# Patient Record
Sex: Male | Born: 2000 | Hispanic: No | Marital: Single | Smoking: Current some day smoker
Health system: Southern US, Community
[De-identification: ages and names within clinical notes are randomized; demographics above are authoritative.]

## PROBLEM LIST (undated history)

## (undated) DIAGNOSIS — R519 Headache, unspecified: Secondary | ICD-10-CM

## (undated) DIAGNOSIS — J45909 Unspecified asthma, uncomplicated: Secondary | ICD-10-CM

## (undated) DIAGNOSIS — B009 Herpesviral infection, unspecified: Secondary | ICD-10-CM

## (undated) DIAGNOSIS — J302 Other seasonal allergic rhinitis: Secondary | ICD-10-CM

## (undated) HISTORY — PX: APPENDECTOMY: SHX54

## (undated) HISTORY — PX: NO PAST SURGERIES: SHX2092

---
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
## 2022-04-14 ENCOUNTER — Emergency Department (HOSPITAL_COMMUNITY): Payer: Medicaid Other

## 2022-04-14 ENCOUNTER — Other Ambulatory Visit: Payer: Self-pay

## 2022-04-14 ENCOUNTER — Emergency Department (HOSPITAL_COMMUNITY)
Admission: EM | Admit: 2022-04-14 | Discharge: 2022-04-14 | Disposition: A | Discharge status: Home / Self Care | Payer: Medicaid Other | Attending: Emergency Medicine | Admitting: Emergency Medicine

## 2022-04-14 ENCOUNTER — Encounter (HOSPITAL_COMMUNITY): Payer: Self-pay

## 2022-04-14 DIAGNOSIS — R112 Nausea with vomiting, unspecified: Secondary | ICD-10-CM | POA: Diagnosis present

## 2022-04-14 DIAGNOSIS — E860 Dehydration: Secondary | ICD-10-CM

## 2022-04-14 DIAGNOSIS — F419 Anxiety disorder, unspecified: Secondary | ICD-10-CM | POA: Diagnosis not present

## 2022-04-14 DIAGNOSIS — F1290 Cannabis use, unspecified, uncomplicated: Secondary | ICD-10-CM

## 2022-04-14 LAB — URINALYSIS, ROUTINE W REFLEX MICROSCOPIC
Bilirubin Urine: NEGATIVE
Glucose, UA: NEGATIVE mg/dL
Hgb urine dipstick: NEGATIVE
Ketones, ur: 20 mg/dL — AB
Leukocytes,Ua: NEGATIVE
Nitrite: NEGATIVE
Protein, ur: NEGATIVE mg/dL
Specific Gravity, Urine: 1.018 (ref 1.005–1.030)
pH: 6 (ref 5.0–8.0)

## 2022-04-14 LAB — CBC
HCT: 44.9 % (ref 39.0–52.0)
Hemoglobin: 15.6 g/dL (ref 13.0–17.0)
MCH: 31.1 pg (ref 26.0–34.0)
MCHC: 34.7 g/dL (ref 30.0–36.0)
MCV: 89.6 fL (ref 80.0–100.0)
Platelets: 225 10*3/uL (ref 150–400)
RBC: 5.01 MIL/uL (ref 4.22–5.81)
RDW: 13 % (ref 11.5–15.5)
WBC: 6.6 10*3/uL (ref 4.0–10.5)
nRBC: 0 % (ref 0.0–0.2)

## 2022-04-14 LAB — TROPONIN I (HIGH SENSITIVITY): Troponin I (High Sensitivity): <2 ng/L (ref <18)

## 2022-04-14 LAB — BASIC METABOLIC PANEL
Anion gap: 13 (ref 5–15)
BUN: 14 mg/dL (ref 6–20)
CO2: 20 mmol/L — ABNORMAL LOW (ref 22–32)
Calcium: 9.5 mg/dL (ref 8.9–10.3)
Chloride: 103 mmol/L (ref 98–111)
Creatinine, Ser: 0.81 mg/dL (ref 0.61–1.24)
GFR, Estimated: >60 mL/min (ref >60)
Glucose, Bld: 103 mg/dL — ABNORMAL HIGH (ref 70–99)
Potassium: 3.5 mmol/L (ref 3.5–5.1)
Sodium: 136 mmol/L (ref 135–145)

## 2022-04-14 LAB — LIPASE, BLOOD: Lipase: 31 U/L (ref 11–51)

## 2022-04-14 MED ORDER — ONDANSETRON 4 MG PO TBDP: 4.0000 mg | ORAL_TABLET | Freq: Three times a day (TID) | ORAL | 0 refills | Status: DC | PRN

## 2022-04-14 MED ORDER — HYDROXYZINE HCL 25 MG PO TABS: 25.0000 mg | ORAL_TABLET | Freq: Three times a day (TID) | ORAL | 0 refills | Status: AC | PRN

## 2022-04-14 MED ORDER — SODIUM CHLORIDE 0.9 % IV BOLUS
1000.0000 mL | Freq: Once | INTRAVENOUS | Status: AC
  Administered 2022-04-14: 1000 mL via INTRAVENOUS

## 2022-04-14 MED ORDER — ONDANSETRON HCL 4 MG/2ML IJ SOLN
4.0000 mg | Freq: Once | INTRAMUSCULAR | Status: AC
  Administered 2022-04-14: 4 mg via INTRAVENOUS
  Filled 2022-04-14: qty 2

## 2022-04-14 NOTE — Discharge Instructions (Addendum)
You need to establish care with a PCP.  Avoid marijuana.

## 2022-04-14 NOTE — ED Notes (Signed)
Patient transported to X-ray 

## 2022-04-14 NOTE — ED Triage Notes (Signed)
Pt c/o SOB, heart racing, lightheaded, N/V. Pt states hasn't been able to eat for a week due to N/V/D.

## 2022-04-14 NOTE — ED Notes (Signed)
Pt is denying nausea, but sts "I still feel anxious and like my heart is beating fast."

## 2022-04-14 NOTE — ED Notes (Addendum)
Pt reports "I think this is just anxiety.  I've been through this before, but I didn't want to depend on anything, so I stopped the treatment.  I dont think I can control it anymore."

## 2022-04-14 NOTE — ED Notes (Signed)
Pt verbalized understanding of discharge instructions. Opportunity for questions provided.  

## 2022-04-14 NOTE — ED Notes (Signed)
Pt reported recent marijuana use to [facility].

## 2022-04-14 NOTE — ED Provider Notes (Signed)
[facility] Provider Note   CSN: [id number] Arrival date & time: 04/14/22  [id number]     History  Chief Complaint  Patient presents with   Shortness of Breath    Marcus Reilly is a 22 y.o. male.  Pt is a 22 yo male with pmhx significant for anxiety and asthma.  Pt said he has been sob, nauseous and poor appetite for the past week.  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  He denies fevers.  He felt like his heart was racing yesterday.  Pt feels like he needs something for anxiety.       Home Medications Prior to Admission medications   Medication Sig Start Date End Date Taking? Authorizing Provider  hydrOXYzine (ATARAX) 25 MG tablet Take 1 tablet (25 mg total) by mouth every 8 (eight) hours as needed for anxiety. 04/14/22  Yes Isla Pence, MD  ondansetron (ZOFRAN-ODT) 4 MG disintegrating tablet Take 1 tablet (4 mg total) by mouth every 8 (eight) hours as needed for nausea or vomiting. 04/14/22  Yes Isla Pence, MD  albuterol (VENTOLIN HFA) 108 (90 Base) MCG/ACT inhaler Inhale 1-2 puffs into the lungs every 4 (four) hours as needed for wheezing or shortness of breath. 05/07/19   [provider]  bismuth subsalicylate (PEPTO BISMOL) 262 MG chewable tablet Chew 262-524 mg by mouth daily as needed for indigestion.    [provider]  cetirizine (ZYRTEC) 10 MG tablet Take 10 mg by mouth daily as needed for allergies (when not taking Benadryl).    [provider]  diphenhydrAMINE (BENADRYL) 25 MG tablet Take 25 mg by mouth every 6 (six) hours as needed for allergies (when not taking Zyrtec).    [provider]  ibuprofen (ADVIL) 200 MG tablet Take 200-800 mg by mouth every 6 (six) hours as needed (for headaches).    [provider]  ibuprofen (ADVIL) 600 MG tablet Take 1 tablet (600 mg total) by mouth every 6 (six) hours as needed. Patient not taking: Reported on  08/14/2021 05/08/21   Isla Pence, MD  methocarbamol (ROBAXIN) 500 MG tablet Take 1 tablet (500 mg total) by mouth 2 (two) times daily. Patient not taking: Reported on 08/14/2021 05/08/21   Isla Pence, MD  ondansetron (ZOFRAN) 8 MG tablet Take 1 tablet (8 mg total) by mouth every 8 (eight) hours as needed for nausea or vomiting. 12/29/21   Pattricia Boss, MD  oxyCODONE-acetaminophen (PERCOCET) 5-325 MG tablet Take 1 tablet by mouth every 4 (four) hours as needed for severe pain. 08/15/21 08/15/22  Stechschulte, Nickola Major, MD  valACYclovir (VALTREX) 1000 MG tablet Take 1 tablet (1,000 mg total) by mouth 2 (two) times daily. Patient not taking: Reported on 08/14/2021 05/13/19   Charmayne Sheer, NP      Allergies    Patient has no known allergies.    Review of Systems   Review of Systems  Gastrointestinal:  Positive for nausea and vomiting.  Psychiatric/Behavioral:  The patient is nervous/anxious.   All other systems reviewed and are negative.   Physical Exam Updated Vital Signs BP (!) 111/55   Pulse 62   Temp (!) 97.3 F (36.3 C) (Oral)   Resp 15   Ht '5\' 4"'$  (1.626 m)   Wt 56.8 kg   SpO2 100%   BMI 21.49 kg/m  Physical Exam Vitals and nursing note reviewed.  Constitutional:      Appearance: He is well-developed.  HENT:     Head: Normocephalic and atraumatic.     Mouth/Throat:     Mouth: Mucous membranes are dry.  Eyes:     Extraocular Movements: Extraocular movements intact.     Pupils: Pupils are equal, round, and reactive to light.  Cardiovascular:     Rate and Rhythm: Normal rate and regular rhythm.  Pulmonary:     Effort: Pulmonary effort is normal.     Breath sounds: Normal breath sounds.  Abdominal:     General: Bowel sounds are normal.     Palpations: Abdomen is soft.  Musculoskeletal:        General: Normal range of motion.     Cervical back: Normal range of motion and neck supple.  Skin:    General: Skin is warm.     Capillary Refill: Capillary refill takes  less than 2 seconds.  Neurological:     General: No focal deficit present.     Mental Status: He is alert and oriented to person, place, and time.  Psychiatric:        Mood and Affect: Mood is anxious.        Behavior: Behavior normal.     ED Results / Procedures / Treatments   Labs (all labs ordered are listed, but only abnormal results are displayed) Labs Reviewed  BASIC METABOLIC PANEL - Abnormal; Notable for the following components:      Result Value   CO2 20 (*)    Glucose, Bld 103 (*)    All other components within normal limits  URINALYSIS, ROUTINE W REFLEX MICROSCOPIC - Abnormal; Notable for the following components:   Ketones, ur 20 (*)    All other components within normal limits  CBC  LIPASE, BLOOD  TROPONIN I (HIGH SENSITIVITY)    EKG EKG Interpretation  Date/Time:  Sunday April 14 2022 08:48:35 EST Ventricular Rate:  82 PR Interval:  122 QRS Duration: 96 QT Interval:  350 QTC Calculation: 408 R Axis:   88 Text Interpretation: Normal sinus rhythm with sinus arrhythmia Normal ECG When compared with ECG of 29-Dec-2021 09:22, PREVIOUS ECG IS PRESENT No significant change since last tracing Confirmed by Isla Pence [phone]) on 04/14/2022 9:04:24 AM  Radiology DG Chest 2 View  Result Date: 04/14/2022 CLINICAL DATA:  Chest pain EXAM: CHEST - 2 VIEW COMPARISON:  None Available. FINDINGS: The heart size and mediastinal contours are within normal limits. Both lungs are clear. The visualized skeletal structures are unremarkable. IMPRESSION: No active cardiopulmonary disease. Electronically Signed   By: Davina Poke D.O.   On: 04/14/2022 09:19    Procedures Procedures    Medications Ordered in ED Medications  sodium chloride 0.9 % bolus 1,000 mL (0 mLs Intravenous Stopped 04/14/22 1057)  ondansetron (ZOFRAN) injection 4 mg (4 mg Intravenous Given 04/14/22 [id number])    ED Course/ Medical Decision Making/ A&P                             Medical Decision  Making Amount and/or Complexity of Data Reviewed Labs: ordered. Radiology: ordered.  Risk Prescription drug management.   This patient presents to the ED for concern of n/v, this involves an extensive number of treatment options, and is a complaint that carries with it a high risk of complications and morbidity.  The differential diagnosis includes infection, anxiety, electrolyte abn, cannabinoid hyperemesis   Co morbidities that complicate the patient evaluation  Asthma and anxiety   Additional  history obtained:  Additional history obtained from epic chart review   Lab Tests:  I Ordered, and personally interpreted labs.  The pertinent results include:  cbc nl, bmp nl, ua nl, trop nl, lip nl, ua + ketones   Imaging Studies ordered:  I ordered imaging studies including cxr  I independently visualized and interpreted imaging which showed No active cardiopulmonary disease.  I agree with the radiologist interpretation   Cardiac Monitoring:  The patient was maintained on a cardiac monitor.  I personally viewed and interpreted the cardiac monitored which showed an underlying rhythm of: nsr   Medicines ordered and prescription drug management:  I ordered medication including ivfs and zofran  for sx  Reevaluation of the patient after these medicines showed that the patient improved I have reviewed the patients home medicines and have made adjustments as needed   Problem List / ED Course:  N/v:  possibly due to anxiety.  No lab abn and he is tolerating fluids after meds.  Pt is stable for d/c.  He is to return if worse.  He needs to establish care with a pcp.   Reevaluation:  After the interventions noted above, I reevaluated the patient and found that they have :improved   Social Determinants of Health:  Lives at home   Dispostion:  After consideration of the diagnostic results and the patients response to treatment, I feel that the patent would benefit from  discharge with outpatient f/u.          Final Clinical Impression(s) / ED Diagnoses Final diagnoses:  Nausea and vomiting, unspecified vomiting type  Dehydration  Anxiety  Marijuana use    Rx / DC Orders ED Discharge Orders          Ordered    hydrOXYzine (ATARAX) 25 MG tablet  Every 8 hours PRN        04/14/22 1100    ondansetron (ZOFRAN-ODT) 4 MG disintegrating tablet  Every 8 hours PRN        04/14/22 1102              Isla Pence, MD 04/14/22 1208
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
## 2022-06-08 ENCOUNTER — Ambulatory Visit (HOSPITAL_COMMUNITY)
Admission: EM | Admit: 2022-06-08 | Discharge: 2022-06-08 | Disposition: A | Discharge status: Home / Self Care | Payer: Medicaid Other | Attending: Physician Assistant | Admitting: Physician Assistant

## 2022-06-08 ENCOUNTER — Other Ambulatory Visit: Payer: Self-pay

## 2022-06-08 ENCOUNTER — Encounter (HOSPITAL_COMMUNITY): Payer: Self-pay | Admitting: Emergency Medicine

## 2022-06-08 DIAGNOSIS — R300 Dysuria: Secondary | ICD-10-CM | POA: Insufficient documentation

## 2022-06-08 DIAGNOSIS — Z113 Encounter for screening for infections with a predominantly sexual mode of transmission: Secondary | ICD-10-CM | POA: Diagnosis present

## 2022-06-08 LAB — POCT URINALYSIS DIP (MANUAL ENTRY)
Bilirubin, UA: NEGATIVE
Blood, UA: NEGATIVE
Glucose, UA: NEGATIVE mg/dL
Ketones, POC UA: NEGATIVE mg/dL
Leukocytes, UA: NEGATIVE
Nitrite, UA: NEGATIVE
Protein Ur, POC: NEGATIVE mg/dL
Spec Grav, UA: 1.02 (ref 1.010–1.025)
Urobilinogen, UA: 1 E.U./dL
pH, UA: 7.5 (ref 5.0–8.0)

## 2022-06-08 LAB — HEPATITIS C ANTIBODY: HCV Ab: NONREACTIVE

## 2022-06-08 LAB — HIV ANTIBODY (ROUTINE TESTING W REFLEX): HIV Screen 4th Generation wRfx: NONREACTIVE

## 2022-06-08 NOTE — ED Provider Notes (Signed)
[facility]    CSN: [id number] Arrival date & time: 06/08/22  1054      History   Chief Complaint Chief Complaint  Patient presents with   SEXUALLY TRANSMITTED DISEASE    HPI Marcus Reilly is a 22 y.o. male.   Patient presents today with a 3-day history of dysuria.  He reports a mild burning sensation particularly after he finishes urinating.  He denies any associated penile discharge, genital lesions, fever, nausea, vomiting.  He denies history of recurrent UTI or nephrolithiasis.  Denies history of diabetes or immunosuppression.  He does not take SGLT2 inhibitor.  He is sexually active with male partners but does not always use condoms.  He denies history of STI.  He has not tried any over-the-counter medication for symptom management.  Denies any recent antibiotic use.    Past Medical History:  Diagnosis Date   Asthma    Headache    HSV infection    Seasonal allergies     Patient Active Problem List   Diagnosis Date Noted   Status post surgery 08/14/2021   Appendicitis 08/14/2021    Past Surgical History:  Procedure Laterality Date   APPENDECTOMY     LAPAROSCOPIC APPENDECTOMY N/A 08/14/2021   Procedure: APPENDECTOMY LAPAROSCOPIC;  Surgeon: Quentin Ore, MD;  Location: [facility] OR;  Service: General;  Laterality: N/A;       Home Medications    Prior to Admission medications   Medication Sig Start Date End Date Taking? Authorizing Provider  albuterol (VENTOLIN HFA) 108 (90 Base) MCG/ACT inhaler Inhale 1-2 puffs into the lungs every 4 (four) hours as needed for wheezing or shortness of breath. 05/07/19   [provider]  bismuth subsalicylate (PEPTO BISMOL) 262 MG chewable tablet Chew 262-524 mg by mouth daily as needed for indigestion. Patient not taking: Reported on 06/08/2022    [provider]  cetirizine (ZYRTEC) 10 MG tablet Take 10 mg by mouth daily as needed for allergies (when not taking Benadryl).    [provider]  diphenhydrAMINE (BENADRYL) 25 MG tablet Take 25 mg by mouth every 6 (six) hours as needed for allergies (when not taking Zyrtec). Patient not taking: Reported on 06/08/2022    [provider]  hydrOXYzine (ATARAX) 25 MG tablet Take 1 tablet (25 mg total) by mouth every 8 (eight) hours as needed for anxiety. Patient not taking: Reported on 06/08/2022 04/14/22   Jacalyn Lefevre, MD  ibuprofen (ADVIL) 200 MG tablet Take 200-800 mg by mouth every 6 (six) hours as needed (for headaches).    [provider]  ibuprofen (ADVIL) 600 MG tablet Take 1 tablet (600 mg total) by mouth every 6 (six) hours as needed. Patient not taking: Reported on 08/14/2021 05/08/21   Jacalyn Lefevre, MD  methocarbamol (ROBAXIN) 500 MG tablet Take 1 tablet (500 mg total) by mouth 2 (two) times daily. Patient not taking: Reported on 08/14/2021 05/08/21   Jacalyn Lefevre, MD  ondansetron (ZOFRAN) 8 MG tablet Take 1 tablet (8 mg total) by mouth every 8 (eight) hours as needed for nausea or vomiting. Patient not taking: Reported on 06/08/2022 12/29/21   Margarita Grizzle, MD  ondansetron (ZOFRAN-ODT) 4 MG disintegrating tablet Take 1 tablet (4 mg total) by mouth every 8 (eight) hours as needed for nausea or vomiting. Patient not taking: Reported on 06/08/2022 04/14/22   Jacalyn Lefevre, MD  oxyCODONE-acetaminophen (PERCOCET) 5-325 MG tablet Take 1 tablet by mouth every 4 (four) hours as needed for severe pain. Patient  not taking: Reported on 06/08/2022 08/15/21 08/15/22  Stechschulte, Hyman Hopes, MD  valACYclovir (VALTREX) 1000 MG tablet Take 1 tablet (1,000 mg total) by mouth 2 (two) times daily. Patient not taking: Reported on 08/14/2021 05/13/19   Viviano Simas, NP    Family History Family History  Problem Relation Age of Onset   Kidney failure Mother     Social History Social History   Tobacco Use   Smoking status: Some Days    Types: Cigarettes, Cigars   Smokeless tobacco: Never  Vaping Use    Vaping Use: Former   Substances: THC  Substance Use Topics   Alcohol use: Yes    Alcohol/week: 2.0 standard drinks of alcohol    Types: 2 Standard drinks or equivalent per week   Drug use: Yes    Types: Marijuana     Allergies   Patient has no known allergies.   Review of Systems Review of Systems  Constitutional:  Negative for activity change, appetite change, fatigue and fever.  Gastrointestinal:  Negative for abdominal pain, diarrhea, nausea and vomiting.  Genitourinary:  Positive for dysuria. Negative for frequency, genital sores, hematuria, penile discharge, penile pain, penile swelling, scrotal swelling and urgency.     Physical Exam Triage Vital Signs ED Triage Vitals  Enc Vitals Group     BP 06/08/22 1143 133/75     Pulse Rate 06/08/22 1143 81     Resp 06/08/22 1143 18     Temp 06/08/22 1143 97.8 F (36.6 C)     Temp Source 06/08/22 1143 Oral     SpO2 06/08/22 1143 98 %     Weight --      Height --      Head Circumference --      Peak Flow --      Pain Score 06/08/22 1139 7     Pain Loc --      Pain Edu? --      Excl. in GC? --    No data found.  Updated Vital Signs BP 133/75 (BP Location: Right Arm)   Pulse 81   Temp 97.8 F (36.6 C) (Oral)   Resp 18   SpO2 98%   Visual Acuity Right Eye Distance:   Left Eye Distance:   Bilateral Distance:    Right Eye Near:   Left Eye Near:    Bilateral Near:     Physical Exam Vitals reviewed.  Constitutional:      General: He is awake.     Appearance: Normal appearance. He is well-developed. He is not ill-appearing.     Comments: Very pleasant male appear stated age in no acute distress sitting comfortably in exam room  HENT:     Head: Normocephalic and atraumatic.     Mouth/Throat:     Pharynx: Uvula midline. No oropharyngeal exudate or posterior oropharyngeal erythema.  Cardiovascular:     Rate and Rhythm: Normal rate and regular rhythm.     Heart sounds: Normal heart sounds, S1 normal and S2  normal. No murmur heard. Pulmonary:     Effort: Pulmonary effort is normal.     Breath sounds: Normal breath sounds. No stridor. No wheezing, rhonchi or rales.     Comments: Clear to auscultation bilateral Abdominal:     General: Bowel sounds are normal.     Palpations: Abdomen is soft.     Tenderness: There is no abdominal tenderness. There is no right CVA tenderness, left CVA tenderness, guarding or rebound.  Neurological:  Mental Status: He is alert.  Psychiatric:        Behavior: Behavior is cooperative.      UC Treatments / Results  Labs (all labs ordered are listed, but only abnormal results are displayed) Labs Reviewed  RPR  HIV ANTIBODY (ROUTINE TESTING W REFLEX)  HEPATITIS C ANTIBODY  POCT URINALYSIS DIP (MANUAL ENTRY)  CYTOLOGY, (ORAL, ANAL, URETHRAL) ANCILLARY ONLY    EKG   Radiology No results found.  Procedures Procedures (including critical care time)  Medications Ordered in UC Medications - No data to display  Initial Impression / Assessment and Plan / UC Course  I have reviewed the triage vital signs and the nursing notes.  Pertinent labs & imaging results that were available during my care of the patient were reviewed by me and considered in my medical decision making (see chart for details).     Patient is well-appearing, afebrile, nontoxic, nontachycardic.  Urine was normal.  STI testing is pending.  Had an at length conversation about STIs and the importance of safe sex practices.  Patient was provided handout on safe sex practices as well as condoms.  He was encouraged to push fluids.  Discussed that if all of his testing is negative and he continues to have symptoms he should follow-up with urology and was given contact information for local provider.  We did discuss potential utility of empirically treating given his current symptoms but he ultimately declined this and will return based on his results.  Discussed that if he has any worsening or  changing symptoms he should return for reevaluation.  All questions answered to patient satisfaction.  Final Clinical Impressions(s) / UC Diagnoses   Final diagnoses:  Dysuria  Screening examination for STI     Discharge Instructions      Your urine was normal with no evidence of infection.  Make sure that you are drinking plenty of fluid.  We will contact you if any of your lab work is abnormal.  Monitor your MyChart for these results as we will only call you if something is positive.  If your lab work is normal and you continue to have symptoms please follow-up with urology; call to schedule an appointment.  Do not have sex until you receive your results.  If you are positive all partners to be tested and treated as well.  Use a condom with each sexual encounter.  If anything changes please return for reevaluation.     ED Prescriptions   None    PDMP not reviewed this encounter.   Jeani Hawking, PA-C 06/08/22 1258

## 2022-06-08 NOTE — Discharge Instructions (Addendum)
Your urine was normal with no evidence of infection.  Make sure that you are drinking plenty of fluid.  We will contact you if any of your lab work is abnormal.  Monitor your MyChart for these results as we will only call you if something is positive.  If your lab work is normal and you continue to have symptoms please follow-up with urology; call to schedule an appointment.  Do not have sex until you receive your results.  If you are positive all partners to be tested and treated as well.  Use a condom with each sexual encounter.  If anything changes please return for reevaluation.

## 2022-06-08 NOTE — ED Triage Notes (Signed)
Burning sometimes while urinating and sometimes after flow is stopped.  Onset 3 days ago.  Denies discharge

## 2022-06-09 LAB — RPR: RPR Ser Ql: NONREACTIVE

## 2022-06-10 LAB — CYTOLOGY, (ORAL, ANAL, URETHRAL) ANCILLARY ONLY
Chlamydia: NEGATIVE
Comment: NEGATIVE
Comment: NEGATIVE
Comment: NORMAL
Neisseria Gonorrhea: NEGATIVE
Trichomonas: NEGATIVE
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
## 2022-07-30 ENCOUNTER — Ambulatory Visit (INDEPENDENT_AMBULATORY_CARE_PROVIDER_SITE_OTHER): Payer: Medicaid Other | Admitting: Neurology

## 2022-07-30 ENCOUNTER — Encounter: Payer: Self-pay | Admitting: Neurology

## 2022-07-30 VITALS — BP 141/78 | HR 67 | Ht 64.0 in | Wt 132.0 lb

## 2022-07-30 DIAGNOSIS — M5481 Occipital neuralgia: Secondary | ICD-10-CM

## 2022-07-30 DIAGNOSIS — M542 Cervicalgia: Secondary | ICD-10-CM

## 2022-07-30 DIAGNOSIS — Z87828 Personal history of other (healed) physical injury and trauma: Secondary | ICD-10-CM

## 2022-07-30 NOTE — Patient Instructions (Signed)
Check MRI of cervical spine.  Further recommendations pending results.

## 2022-08-03 ENCOUNTER — Encounter (HOSPITAL_COMMUNITY): Payer: Self-pay

## 2022-08-03 ENCOUNTER — Ambulatory Visit (HOSPITAL_COMMUNITY)
Admission: EM | Admit: 2022-08-03 | Discharge: 2022-08-03 | Disposition: A | Discharge status: Home / Self Care | Payer: Medicaid Other | Attending: Physician Assistant | Admitting: Physician Assistant

## 2022-08-03 DIAGNOSIS — N481 Balanitis: Secondary | ICD-10-CM | POA: Diagnosis not present

## 2022-08-03 MED ORDER — CLOTRIMAZOLE 1 % EX CREA: TOPICAL_CREAM | CUTANEOUS | 0 refills | Status: AC

## 2022-08-03 NOTE — ED Triage Notes (Signed)
Patient here today with c/o burning sensation at the tip of his penis and pain in groin area X 2 days. He notices some white substance at the gran and irritation. His girlfriend has a yeast infection. Patient is uncircumcised.

## 2022-08-03 NOTE — Discharge Instructions (Signed)
Keep the area clean with soap and water.  Apply clotrimazole twice daily.  Use hypoallergenic soaps and detergents.  Use this cream for minimum of 2 weeks.  If it is not improving quickly or if anything changes and you develop additional lesions or spread of rash please be seen immediately.

## 2022-08-03 NOTE — ED Provider Notes (Signed)
[facility]    CSN: [id number] Arrival date & time: 08/03/22  1201      History   Chief Complaint Chief Complaint  Patient presents with   Rash    Penis    HPI Saahas Reilly is a 22 y.o. male.   Patient presents today with a 2-day history of irritated rash of his penis.  Reports that symptoms have gradually worsened prompting evaluation.  He is sexually active with a male partner and reports that she was recently treated for yeast infection and he is concerned that he might have gotten this from her.  Denies any changes to personal hygiene products including soaps or detergents.  Denies any medication changes or antibiotic use.  He denies history of diabetes, HIV, immunosuppression has not take SGLT2 inhibitor.  He has not tried any over-the-counter medication for symptom management.  He does have a history of HSV but current symptoms are not similar to previous episodes of this condition.  He has been cleaning area more aggressively which has caused some increase in irritation.  Denies any additional symptoms including fever, nausea, vomiting, abdominal pain, penile discharge, dysuria, urinary symptoms.    Past Medical History:  Diagnosis Date   Asthma    Headache    HSV infection    Seasonal allergies     Patient Active Problem List   Diagnosis Date Noted   Status post surgery 08/14/2021   Appendicitis 08/14/2021    Past Surgical History:  Procedure Laterality Date   APPENDECTOMY     LAPAROSCOPIC APPENDECTOMY N/A 08/14/2021   Procedure: APPENDECTOMY LAPAROSCOPIC;  Surgeon: Quentin Ore, MD;  Location: [facility] OR;  Service: General;  Laterality: N/A;       Home Medications    Prior to Admission medications   Medication Sig Start Date End Date Taking? Authorizing Provider  albuterol (VENTOLIN HFA) 108 (90 Base) MCG/ACT inhaler Inhale 1-2 puffs into the lungs every 4 (four) hours as needed for wheezing or shortness of breath. 05/07/19  Yes  [provider]  clotrimazole (LOTRIMIN) 1 % cream Apply to affected area 2 times daily 08/03/22  Yes Shemeka Wardle K, PA-C  cetirizine (ZYRTEC) 10 MG tablet Take 10 mg by mouth daily as needed for allergies (when not taking Benadryl).    [provider]  hydrOXYzine (ATARAX) 25 MG tablet Take 1 tablet (25 mg total) by mouth every 8 (eight) hours as needed for anxiety. Patient not taking: Reported on 07/30/2022 04/14/22   Jacalyn Lefevre, MD    Family History Family History  Problem Relation Age of Onset   Kidney failure Mother    Migraines Brother     Social History Social History   Tobacco Use   Smoking status: Some Days    Types: Cigarettes, Cigars   Smokeless tobacco: Never  Vaping Use   Vaping Use: Former   Substances: THC  Substance Use Topics   Alcohol use: Not Currently    Alcohol/week: 2.0 standard drinks of alcohol    Types: 2 Standard drinks or equivalent per week   Drug use: Yes    Types: Marijuana     Allergies   Patient has no known allergies.   Review of Systems Review of Systems  Constitutional:  Positive for activity change. Negative for appetite change, fatigue and fever.  Gastrointestinal:  Negative for abdominal pain, diarrhea, nausea and vomiting.  Genitourinary:  Negative for dysuria, frequency, genital sores, penile pain, penile swelling and urgency.  Skin:  Positive for rash.  Physical Exam Triage Vital Signs ED Triage Vitals  Enc Vitals Group     BP 08/03/22 1227 129/87     Pulse Rate 08/03/22 1227 79     Resp 08/03/22 1227 16     Temp 08/03/22 1227 98 F (36.7 C)     Temp Source 08/03/22 1227 Oral     SpO2 08/03/22 1227 97 %     Weight 08/03/22 1227 132 lb (59.9 kg)     Height 08/03/22 1227 5\' 4"  (1.626 m)     Head Circumference --      Peak Flow --      Pain Score 08/03/22 1226 7     Pain Loc --      Pain Edu? --      Excl. in GC? --    No data found.  Updated Vital Signs BP 129/87 (BP Location: Right  Arm)   Pulse 79   Temp 98 F (36.7 C) (Oral)   Resp 16   Ht 5\' 4"  (1.626 m)   Wt 132 lb (59.9 kg)   SpO2 97%   BMI 22.66 kg/m   Visual Acuity Right Eye Distance:   Left Eye Distance:   Bilateral Distance:    Right Eye Near:   Left Eye Near:    Bilateral Near:     Physical Exam Vitals reviewed. Exam conducted with a chaperone present.  Constitutional:      General: He is awake.     Appearance: Normal appearance. He is well-developed. He is not ill-appearing.     Comments: Very pleasant male appears stated age in no acute distress sitting comfortably in exam room  HENT:     Head: Normocephalic and atraumatic.  Cardiovascular:     Rate and Rhythm: Normal rate and regular rhythm.     Heart sounds: Normal heart sounds, S1 normal and S2 normal. No murmur heard. Pulmonary:     Effort: Pulmonary effort is normal.     Breath sounds: Normal breath sounds. No stridor. No wheezing, rhonchi or rales.     Comments: Clear to auscultation bilaterally Abdominal:     General: Bowel sounds are normal.     Palpations: Abdomen is soft.     Tenderness: There is no abdominal tenderness. There is no right CVA tenderness, left CVA tenderness, guarding or rebound.  Genitourinary:    Penis: Uncircumcised. No phimosis, paraphimosis or discharge.      Testes: Normal.     Comments: Dee, CMA present as chaperone during exam.  Erythematous maculopapular rash with white drainage noted along glans. Neurological:     Mental Status: He is alert.  Psychiatric:        Behavior: Behavior is cooperative.      UC Treatments / Results  Labs (all labs ordered are listed, but only abnormal results are displayed) Labs Reviewed - No data to display  EKG   Radiology No results found.  Procedures Procedures (including critical care time)  Medications Ordered in UC Medications - No data to display  Initial Impression / Assessment and Plan / UC Course  I have reviewed the triage vital signs and  the nursing notes.  Pertinent labs & imaging results that were available during my care of the patient were reviewed by me and considered in my medical decision making (see chart for details).     Symptoms are consistent with yeast balanitis.  He was encouraged to keep the area clean and apply clotrimazole ointment twice daily for minimum 2  weeks.  He is to use hypoallergenic soaps and detergents.  Discussed that if his symptoms persist or if they are not improving quickly he should return for reevaluation.  If he develops any additional symptoms including abdominal pain, penile discharge, urinary symptoms, fever he needs to be seen immediately.  Strict return precautions given.    Final Clinical Impressions(s) / UC Diagnoses   Final diagnoses:  Balanitis     Discharge Instructions      Keep the area clean with soap and water.  Apply clotrimazole twice daily.  Use hypoallergenic soaps and detergents.  Use this cream for minimum of 2 weeks.  If it is not improving quickly or if anything changes and you develop additional lesions or spread of rash please be seen immediately.     ED Prescriptions     Medication Sig Dispense Auth. Provider   clotrimazole (LOTRIMIN) 1 % cream Apply to affected area 2 times daily 30 g Tyjae Issa K, PA-C      PDMP not reviewed this encounter.   Jeani Hawking, PA-C 08/03/22 1305

## 2022-08-15 ENCOUNTER — Encounter: Payer: Self-pay | Admitting: Neurology

## 2022-08-21 ENCOUNTER — Other Ambulatory Visit: Payer: Medicaid Other

## 2022-08-21 ENCOUNTER — Encounter: Payer: Self-pay | Admitting: Neurology

## 2022-08-23 ENCOUNTER — Other Ambulatory Visit: Payer: Medicaid Other

## 2022-09-05 ENCOUNTER — Telehealth: Payer: Self-pay

## 2022-09-05 DIAGNOSIS — Z87828 Personal history of other (healed) physical injury and trauma: Secondary | ICD-10-CM

## 2022-09-05 NOTE — Telephone Encounter (Signed)
PA denied for MRI

## 2022-09-12 ENCOUNTER — Telehealth: Payer: Self-pay | Admitting: Neurology

## 2022-09-12 NOTE — Telephone Encounter (Signed)
Patient is calling to return a missed call.Marcus Reilly

## 2022-09-12 NOTE — Telephone Encounter (Signed)
LMOVM per dr.Jaffe, MRI denied, I would at least like to get cervical spine X-ray

## 2022-09-13 NOTE — Telephone Encounter (Signed)
See encounter 09/12/22

## 2023-01-09 ENCOUNTER — Encounter (HOSPITAL_COMMUNITY): Payer: Self-pay | Admitting: Emergency Medicine

## 2023-01-09 ENCOUNTER — Ambulatory Visit (HOSPITAL_COMMUNITY)
Admission: EM | Admit: 2023-01-09 | Discharge: 2023-01-09 | Disposition: A | Discharge status: Home / Self Care | Payer: Medicaid Other | Attending: Family Medicine | Admitting: Family Medicine

## 2023-01-09 DIAGNOSIS — R1032 Left lower quadrant pain: Secondary | ICD-10-CM | POA: Diagnosis not present

## 2023-01-09 DIAGNOSIS — N50812 Left testicular pain: Secondary | ICD-10-CM

## 2023-01-09 NOTE — ED Provider Notes (Signed)
[facility]   [id number] 01/09/23 Arrival Time: 1323  ASSESSMENT & PLAN:  1. Left lower quadrant abdominal pain   2. Left testicular pain    No signs of testicular torsion. Benign abd pain. Declines lab work including urethral cytology. Is sexually active. OTC Tylenol if needed. Prefers to observe next 48 hours. Agrees to ED eval for imaging if pain returns or worsens.  Reviewed expectations re: course of current medical issues. Questions answered. Outlined signs and symptoms indicating need for more acute intervention. Patient verbalized understanding. After Visit Summary given.   SUBJECTIVE:  Marcus Reilly is a 22 y.o. male who presents with complaint of mild and intermittent LLQ abd pain.  A few days ago note soreness in L testicle; didn't last long; has not recurred. Denies abd/scrotal injury. Denies penile discharge. Normal bowel/bladder habits. Denies fever.  Past Surgical History:  Procedure Laterality Date   APPENDECTOMY     LAPAROSCOPIC APPENDECTOMY N/A 08/14/2021   Procedure: APPENDECTOMY LAPAROSCOPIC;  Surgeon: Quentin Ore, MD;  Location: [facility];  Service: General;  Laterality: N/A;    OBJECTIVE:  Vitals:   01/09/23 1343  BP: 135/81  Pulse: 61  Resp: 16  Temp: 98.2 F (36.8 C)  TempSrc: Oral  SpO2: 98%    General appearance: alert, cooperative, appears stated age and no distress Throat: lips, mucosa, and tongue normal; teeth and gums normal Lungs: unlabored respirations; speaks full sentences without difficulty Back: no CVA tenderness; FROM at waist Abdomen: soft, non-tender; without guarding/rebound TTP GU: normal appearing genitalia; normal scrotal/testicular exam; normal cremasteric reflex Skin: warm and dry Psychological: alert and cooperative; normal mood and affect.  Reviewed: Results for orders placed or performed during the hospital encounter of 06/08/22  RPR  Result Value Ref Range   RPR Ser Ql NON REACTIVE  NON REACTIVE  HIV Antibody (routine testing w rflx)  Result Value Ref Range   HIV Screen 4th Generation wRfx Non Reactive Non Reactive  Hepatitis C antibody  Result Value Ref Range   HCV Ab NON REACTIVE NON REACTIVE  POC urinalysis dipstick  Result Value Ref Range   Color, UA yellow yellow   Clarity, UA clear clear   Glucose, UA negative negative mg/dL   Bilirubin, UA negative negative   Ketones, POC UA negative negative mg/dL   Spec Grav, UA 4.098 1.191 - 1.025   Blood, UA negative negative   pH, UA 7.5 5.0 - 8.0   Protein Ur, POC negative negative mg/dL   Urobilinogen, UA 1.0 0.2 or 1.0 E.U./dL   Nitrite, UA Negative Negative   Leukocytes, UA Negative Negative  Cytology ancillary only  Result Value Ref Range   Neisseria Gonorrhea Negative    Chlamydia Negative    Trichomonas Negative    Comment Normal Reference Range Trichomonas - Negative    Comment Normal Reference Ranger Chlamydia - Negative    Comment      Normal Reference Range Neisseria Gonorrhea - Negative    Labs Reviewed - No data to display  No Known Allergies  Past Medical History:  Diagnosis Date   Asthma    Headache    HSV infection    Seasonal allergies    Family History  Problem Relation Age of Onset   Kidney failure Mother    Migraines Brother    Social History   Socioeconomic History   Marital status: Single    Spouse name: Not on file   Number of children: Not on file   Years of  education: Not on file   Highest education level: Not on file  Occupational History   Not on file  Tobacco Use   Smoking status: Some Days    Types: Cigarettes, Cigars   Smokeless tobacco: Never  Vaping Use   Vaping status: Former   Substances: THC  Substance and Sexual Activity   Alcohol use: Not Currently    Alcohol/week: 2.0 standard drinks of alcohol    Types: 2 Standard drinks or equivalent per week   Drug use: Yes    Types: Marijuana   Sexual activity: Yes  Other Topics Concern   Not on file   Social History Narrative   Right handed   .   Social Determinants of Health   Financial Resource Strain: Not on File (01/02/2022)   Received from [employer]    Financial Resource Strain: 0  Food Insecurity: Not on File (01/02/2022)   Received from [employer]    Food: 0  Transportation Needs: Not on File (01/02/2022)   Received from [employer] Needs    Transportation: 0  Physical Activity: Not at Risk (04/16/2022)   Received from [place], [place]   Physical Activity    Physical Activity: 1  Stress: At Risk (04/16/2022)   Received from [place], [place]   Stress    Stress: 2  Social Connections: Not at Risk (04/16/2022)   Received from [employer] [facility] [employer] [facility]   Social Connections    Connectedness: 1  Intimate Partner Violence: Not on file           Mardella Layman, MD 01/09/23 [phone]

## 2023-01-09 NOTE — ED Triage Notes (Signed)
Pt c/o left side abdominal pain after he went to gym Monday now pain is down in left testicle. Pt states pain comes and goes away.

## 2023-01-09 NOTE — ED Notes (Signed)
Patient is being discharged from the Urgent Care and sent to the Emergency Department via personal opperated vehicle . Per Dr Mardella Layman, patient is in need of higher level of care due to groin pain. Patient is aware and verbalizes understanding of plan of care.  Vitals:   01/09/23 1343  BP: 135/81  Pulse: 61  Resp: 16  Temp: 98.2 F (36.8 C)  SpO2: 98%

## 2023-06-05 ENCOUNTER — Other Ambulatory Visit: Payer: Self-pay

## 2023-06-05 ENCOUNTER — Emergency Department (HOSPITAL_COMMUNITY)
Admission: EM | Admit: 2023-06-05 | Discharge: 2023-06-06 | Disposition: A | Discharge status: Home / Self Care | Attending: Emergency Medicine | Admitting: Emergency Medicine

## 2023-06-05 ENCOUNTER — Emergency Department (HOSPITAL_COMMUNITY)

## 2023-06-05 ENCOUNTER — Encounter (HOSPITAL_COMMUNITY): Payer: Self-pay | Admitting: *Deleted

## 2023-06-05 DIAGNOSIS — R519 Headache, unspecified: Secondary | ICD-10-CM | POA: Diagnosis present

## 2023-06-05 DIAGNOSIS — M5481 Occipital neuralgia: Secondary | ICD-10-CM | POA: Insufficient documentation

## 2023-06-05 LAB — CBC WITH DIFFERENTIAL/PLATELET
Abs Immature Granulocytes: 0.04 10*3/uL (ref 0.00–0.07)
Basophils Absolute: 0 10*3/uL (ref 0.0–0.1)
Basophils Relative: 1 %
Eosinophils Absolute: 0.6 10*3/uL — ABNORMAL HIGH (ref 0.0–0.5)
Eosinophils Relative: 7 %
HCT: 47.6 % (ref 39.0–52.0)
Hemoglobin: 16.1 g/dL (ref 13.0–17.0)
Immature Granulocytes: 1 %
Lymphocytes Relative: 34 %
Lymphs Abs: 2.9 10*3/uL (ref 0.7–4.0)
MCH: 30.4 pg (ref 26.0–34.0)
MCHC: 33.8 g/dL (ref 30.0–36.0)
MCV: 90 fL (ref 80.0–100.0)
Monocytes Absolute: 0.7 10*3/uL (ref 0.1–1.0)
Monocytes Relative: 8 %
Neutro Abs: 4.3 10*3/uL (ref 1.7–7.7)
Neutrophils Relative %: 49 %
Platelets: 243 10*3/uL (ref 150–400)
RBC: 5.29 MIL/uL (ref 4.22–5.81)
RDW: 12.8 % (ref 11.5–15.5)
WBC: 8.4 10*3/uL (ref 4.0–10.5)
nRBC: 0 % (ref 0.0–0.2)

## 2023-06-05 LAB — RESP PANEL BY RT-PCR (RSV, FLU A&B, COVID)  RVPGX2
Influenza A by PCR: NEGATIVE
Influenza B by PCR: NEGATIVE
Resp Syncytial Virus by PCR: NEGATIVE
SARS Coronavirus 2 by RT PCR: NEGATIVE

## 2023-06-05 LAB — BASIC METABOLIC PANEL WITH GFR
Anion gap: 8 (ref 5–15)
BUN: 16 mg/dL (ref 6–20)
CO2: 23 mmol/L (ref 22–32)
Calcium: 9.4 mg/dL (ref 8.9–10.3)
Chloride: 107 mmol/L (ref 98–111)
Creatinine, Ser: 0.84 mg/dL (ref 0.61–1.24)
GFR, Estimated: >60 mL/min (ref >60)
Glucose, Bld: 83 mg/dL (ref 70–99)
Potassium: 3.9 mmol/L (ref 3.5–5.1)
Sodium: 138 mmol/L (ref 135–145)

## 2023-06-05 MED ORDER — ACETAMINOPHEN 500 MG PO TABS
1000.0000 mg | ORAL_TABLET | Freq: Once | ORAL | Status: AC
  Administered 2023-06-05: 1000 mg via ORAL
  Filled 2023-06-05: qty 2

## 2023-06-05 NOTE — ED Provider Triage Note (Signed)
 Emergency Medicine Provider Triage Evaluation Note  Livio Ledwith , a 23 y.o. male  was evaluated in triage.  Pt complains of 2 days of left sided headache radiating to his eye and down the left side of his neck.  Review of Systems  Positive: Sensitivity to light, neck rigidity Negative: Focal weakness or change in sensation fever sick contact recent trauma  Physical Exam  BP 134/73 (BP Location: Right Arm)   Pulse 73   Temp 98.4 F (36.9 C)   Resp 18   SpO2 100%  Gen:   Awake, no distress   Resp:  Normal effort  MSK:   Moves extremities without difficulty  Other:  Refuses to move chin to chest  Medical Decision Making  Medically screening exam initiated at 5:48 PM.  Appropriate orders placed.  Jayvin Dejarnette was informed that the remainder of the evaluation will be completed by another provider, this initial triage assessment does not replace that evaluation, and the importance of remaining in the ED until their evaluation is complete.     Eudora Heron, PA-C 06/05/23 1750

## 2023-06-05 NOTE — ED Triage Notes (Signed)
 Pt is here for headache with nausea and stiffness in his neck.  Pt states that he feels like he has severe pressure in his head, no fever or chills.

## 2023-06-06 MED ORDER — DIAZEPAM 5 MG PO TABS
5.0000 mg | ORAL_TABLET | Freq: Once | ORAL | Status: AC
  Administered 2023-06-06: 5 mg via ORAL
  Filled 2023-06-06: qty 1

## 2023-06-06 MED ORDER — SODIUM CHLORIDE 0.9 % IV BOLUS
1000.0000 mL | Freq: Once | INTRAVENOUS | Status: AC
  Administered 2023-06-06: 1000 mL via INTRAVENOUS

## 2023-06-06 MED ORDER — KETOROLAC TROMETHAMINE 15 MG/ML IJ SOLN
15.0000 mg | Freq: Once | INTRAMUSCULAR | Status: AC
  Administered 2023-06-06: 15 mg via INTRAVENOUS
  Filled 2023-06-06: qty 1

## 2023-06-06 MED ORDER — DIPHENHYDRAMINE HCL 50 MG/ML IJ SOLN
25.0000 mg | Freq: Once | INTRAMUSCULAR | Status: AC
  Administered 2023-06-06: 25 mg via INTRAVENOUS
  Filled 2023-06-06: qty 1

## 2023-06-06 MED ORDER — METOCLOPRAMIDE HCL 5 MG/ML IJ SOLN
10.0000 mg | Freq: Once | INTRAMUSCULAR | Status: AC
  Administered 2023-06-06: 10 mg via INTRAVENOUS
  Filled 2023-06-06: qty 2

## 2023-06-06 NOTE — Discharge Instructions (Signed)
 Please follow-up with your primary care doctor, I believe that you have a type of headache, known as occipital neuralgia, please take anti-inflammatory such as ibuprofen  800 mg 3 times a day for the pain, as needed.  Return to the ER if you have any fevers, chills, vision changes, or confusion
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
## 2024-02-22 ENCOUNTER — Ambulatory Visit (HOSPITAL_COMMUNITY): Admission: EM | Admit: 2024-02-22 | Discharge: 2024-02-22 | Disposition: A | Discharge status: Home / Self Care

## 2024-02-22 ENCOUNTER — Encounter (HOSPITAL_COMMUNITY): Payer: Self-pay

## 2024-02-22 DIAGNOSIS — G9331 Postviral fatigue syndrome: Secondary | ICD-10-CM

## 2024-02-22 LAB — POCT RAPID STREP A (OFFICE): Rapid Strep A Screen: NEGATIVE

## 2024-02-22 MED ORDER — AZELASTINE HCL 0.1 % NA SOLN: 1.0000 | Freq: Two times a day (BID) | NASAL | 1 refills | Status: AC

## 2024-02-22 MED ORDER — PREDNISONE 20 MG PO TABS: 40.0000 mg | ORAL_TABLET | Freq: Every day | ORAL | 0 refills | Status: AC

## 2024-02-22 MED ORDER — PROMETHAZINE-DM 6.25-15 MG/5ML PO SYRP: 10.0000 mL | ORAL_SOLUTION | Freq: Three times a day (TID) | ORAL | 0 refills | Status: AC | PRN

## 2024-02-22 MED ORDER — ALBUTEROL SULFATE HFA 108 (90 BASE) MCG/ACT IN AERS: 1.0000 | INHALATION_SPRAY | RESPIRATORY_TRACT | 1 refills | Status: AC | PRN

## 2024-02-22 NOTE — ED Provider Notes (Signed)
 " [facility]  Note:  This document was prepared using Dragon voice recognition software and may include unintentional dictation errors.  MRN: [id number] DOB: 2000/06/08  Subjective:   Marcus Reilly is a 24 y.o. male presenting for evaluation of sore throat and fever x 3 days.  Patient reports prior to the 3 days he had nasal congestion, cough, body aches, fever, nausea/vomiting however most of his symptoms have resolved except for persistent sore throat, nasal congestion, intermittent fever.  Patient was concern for possible strep pharyngitis and is here for testing to make sure that sore throat is postviral in nature.  Patient denies taking any over-the-counter medication today but has been taking over-the-counter cough and cold medication with minimal improvement.  Patient reports he is most concerned about the sore throat.  No shortness of breath, chest pain, weakness, dizziness, fever at this time.  Current Medications[1]   Allergies[2]  Past Medical History:  Diagnosis Date   Asthma    Headache    HSV infection    Seasonal allergies      Past Surgical History:  Procedure Laterality Date   APPENDECTOMY     LAPAROSCOPIC APPENDECTOMY N/A 08/14/2021   Procedure: APPENDECTOMY LAPAROSCOPIC;  Surgeon: Stechschulte, Deward PARAS, MD;  Location: [facility] OR;  Service: General;  Laterality: N/A;    Family History  Problem Relation Age of Onset   Kidney failure Mother    Migraines Brother     Social History[3]  ROS Refer to HPI for ROS details.  Objective:    Vitals: BP (!) 123/90 (BP Location: Left Arm)   Pulse (!) 103   Temp 98.4 F (36.9 C) (Oral)   Resp 18   SpO2 98%   Physical Exam Vitals and nursing note reviewed.  Constitutional:      General: He is not in acute distress.    Appearance: Normal appearance. He is well-developed. He is not ill-appearing or toxic-appearing.  HENT:     Head: Normocephalic.     Nose: Congestion present. No  rhinorrhea.     Mouth/Throat:     Mouth: Mucous membranes are moist.     Pharynx: Oropharynx is clear. Posterior oropharyngeal erythema present. No oropharyngeal exudate.  Cardiovascular:     Rate and Rhythm: Normal rate.  Pulmonary:     Effort: Pulmonary effort is normal. No respiratory distress.     Breath sounds: No stridor. No wheezing.  Skin:    General: Skin is warm and dry.  Neurological:     General: No focal deficit present.     Mental Status: He is alert and oriented to person, place, and time.  Psychiatric:        Mood and Affect: Mood normal.        Behavior: Behavior normal.     Procedures  Results for orders placed or performed during the hospital encounter of 02/22/24 (from the past 24 hours)  POC rapid strep A     Status: None   Collection Time: 02/22/24  9:07 AM  Result Value Ref Range   Rapid Strep A Screen Negative Negative    Assessment and Plan :     Discharge Instructions       1. Postviral syndrome (Primary) - POC rapid strep A complete and UC is negative for strep pharyngitis. - albuterol  (VENTOLIN  HFA) 108 (90 Base) MCG/ACT inhaler; Inhale 1-2 puffs into the lungs every 4 (four) hours as needed for wheezing or shortness of breath.  Dispense: 8 g; Refill: 1 - predniSONE  (  DELTASONE ) 20 MG tablet; Take 2 tablets (40 mg total) by mouth daily for 5 days.  Dispense: 10 tablet; Refill: 0 - azelastine  (ASTELIN ) 0.1 % nasal spray; Place 1 spray into both nostrils 2 (two) times daily. Use in each nostril as directed  Dispense: 30 mL; Refill: 1 - promethazine -dextromethorphan (PROMETHAZINE -DM) 6.25-15 MG/5ML syrup; Take 10 mLs by mouth 3 (three) times daily as needed for cough.  Dispense: 240 mL; Refill: 0  -Continue to monitor symptoms for any change in severity if there is any escalation of current symptoms or development of new symptoms follow-up in ER for further evaluation and management.      Mordecai Tindol B Jilliann Subramanian    [1] No current  facility-administered medications for this encounter.  Current Outpatient Medications:    azelastine  (ASTELIN ) 0.1 % nasal spray, Place 1 spray into both nostrils 2 (two) times daily. Use in each nostril as directed, Disp: 30 mL, Rfl: 1   predniSONE  (DELTASONE ) 20 MG tablet, Take 2 tablets (40 mg total) by mouth daily for 5 days., Disp: 10 tablet, Rfl: 0   promethazine -dextromethorphan (PROMETHAZINE -DM) 6.25-15 MG/5ML syrup, Take 10 mLs by mouth 3 (three) times daily as needed for cough., Disp: 240 mL, Rfl: 0   albuterol  (VENTOLIN  HFA) 108 (90 Base) MCG/ACT inhaler, Inhale 1-2 puffs into the lungs every 4 (four) hours as needed for wheezing or shortness of breath., Disp: 8 g, Rfl: 1   cetirizine (ZYRTEC) 10 MG tablet, Take 10 mg by mouth daily as needed for allergies (when not taking Benadryl )., Disp: , Rfl:    clotrimazole  (LOTRIMIN ) 1 % cream, Apply to affected area 2 times daily (Patient not taking: Reported on 01/09/2023), Disp: 30 g, Rfl: 0   hydrOXYzine  (ATARAX ) 25 MG tablet, Take 1 tablet (25 mg total) by mouth every 8 (eight) hours as needed for anxiety. (Patient not taking: Reported on 07/30/2022), Disp: 12 tablet, Rfl: 0 [2] No Known Allergies [3]  Social History Tobacco Use   Smoking status: Some Days    Types: Cigarettes, Cigars   Smokeless tobacco: Never  Vaping Use   Vaping status: Former   Substances: THC  Substance Use Topics   Alcohol use: Not Currently    Alcohol/week: 2.0 standard drinks of alcohol    Types: 2 Standard drinks or equivalent per week   Drug use: Yes    Types: Marijuana     Aurea Goodell B, NP 02/22/24 0935  "

## 2024-02-22 NOTE — ED Triage Notes (Signed)
 PT states sore throat and fever x 3 days. Patient has been taken OTC medication to help with symptoms.

## 2024-02-22 NOTE — Discharge Instructions (Signed)
" °  1. Postviral syndrome (Primary) - POC rapid strep A complete and UC is negative for strep pharyngitis. - albuterol  (VENTOLIN  HFA) 108 (90 Base) MCG/ACT inhaler; Inhale 1-2 puffs into the lungs every 4 (four) hours as needed for wheezing or shortness of breath.  Dispense: 8 g; Refill: 1 - predniSONE  (DELTASONE ) 20 MG tablet; Take 2 tablets (40 mg total) by mouth daily for 5 days.  Dispense: 10 tablet; Refill: 0 - azelastine  (ASTELIN ) 0.1 % nasal spray; Place 1 spray into both nostrils 2 (two) times daily. Use in each nostril as directed  Dispense: 30 mL; Refill: 1 - promethazine -dextromethorphan (PROMETHAZINE -DM) 6.25-15 MG/5ML syrup; Take 10 mLs by mouth 3 (three) times daily as needed for cough.  Dispense: 240 mL; Refill: 0  -Continue to monitor symptoms for any change in severity if there is any escalation of current symptoms or development of new symptoms follow-up in ER for further evaluation and management. "
